# Patient Record
Sex: Male | Born: 1950 | Race: White | Hispanic: No | Marital: Married
Health system: Southern US, Community
[De-identification: ages and names within clinical notes are randomized; demographics above are authoritative.]

## PROBLEM LIST (undated history)

## (undated) DIAGNOSIS — E119 Type 2 diabetes mellitus without complications: Secondary | ICD-10-CM

## (undated) DIAGNOSIS — I509 Heart failure, unspecified: Secondary | ICD-10-CM

## (undated) DIAGNOSIS — I10 Essential (primary) hypertension: Secondary | ICD-10-CM

---

## 2020-06-29 ENCOUNTER — Ambulatory Visit: Payer: Self-pay | Admitting: Cardiology

## 2020-07-12 DIAGNOSIS — R1900 Intra-abdominal and pelvic swelling, mass and lump, unspecified site: Secondary | ICD-10-CM | POA: Diagnosis not present

## 2020-07-12 DIAGNOSIS — R590 Localized enlarged lymph nodes: Secondary | ICD-10-CM | POA: Diagnosis not present

## 2020-07-12 DIAGNOSIS — Z6820 Body mass index (BMI) 20.0-20.9, adult: Secondary | ICD-10-CM | POA: Diagnosis not present

## 2020-07-13 ENCOUNTER — Other Ambulatory Visit: Payer: Self-pay | Admitting: Nurse Practitioner

## 2020-07-13 DIAGNOSIS — R1900 Intra-abdominal and pelvic swelling, mass and lump, unspecified site: Secondary | ICD-10-CM

## 2020-07-14 ENCOUNTER — Other Ambulatory Visit: Payer: Self-pay

## 2020-07-14 ENCOUNTER — Other Ambulatory Visit: Payer: Self-pay | Admitting: Internal Medicine

## 2020-07-14 ENCOUNTER — Ambulatory Visit
Admission: RE | Admit: 2020-07-14 | Discharge: 2020-07-14 | Disposition: A | Discharge status: Home / Self Care | Payer: Medicare HMO | Source: Ambulatory Visit | Attending: Nurse Practitioner | Admitting: Nurse Practitioner

## 2020-07-14 ENCOUNTER — Other Ambulatory Visit: Payer: Self-pay | Admitting: Nurse Practitioner

## 2020-07-14 DIAGNOSIS — R590 Localized enlarged lymph nodes: Secondary | ICD-10-CM | POA: Diagnosis not present

## 2020-07-14 DIAGNOSIS — I700 Atherosclerosis of aorta: Secondary | ICD-10-CM | POA: Diagnosis not present

## 2020-07-14 DIAGNOSIS — R1900 Intra-abdominal and pelvic swelling, mass and lump, unspecified site: Secondary | ICD-10-CM | POA: Diagnosis not present

## 2020-07-14 DIAGNOSIS — Z8739 Personal history of other diseases of the musculoskeletal system and connective tissue: Secondary | ICD-10-CM | POA: Diagnosis not present

## 2020-07-14 HISTORY — DX: Essential (primary) hypertension: I10

## 2020-07-14 HISTORY — DX: Heart failure, unspecified: I50.9

## 2020-07-14 HISTORY — DX: Type 2 diabetes mellitus without complications: E11.9

## 2020-07-14 LAB — POCT I-STAT CREATININE: Creatinine, Ser: 0.9 mg/dL (ref 0.61–1.24)

## 2020-07-14 MED ORDER — IOHEXOL 300 MG/ML  SOLN
100.0000 mL | Freq: Once | INTRAMUSCULAR | Status: AC | PRN
  Administered 2020-07-14: 100 mL via INTRAVENOUS

## 2020-07-15 ENCOUNTER — Other Ambulatory Visit: Payer: Self-pay | Admitting: Nurse Practitioner

## 2020-07-15 DIAGNOSIS — R590 Localized enlarged lymph nodes: Secondary | ICD-10-CM | POA: Diagnosis not present

## 2020-07-15 DIAGNOSIS — R1909 Other intra-abdominal and pelvic swelling, mass and lump: Secondary | ICD-10-CM

## 2020-07-19 ENCOUNTER — Other Ambulatory Visit: Payer: Self-pay | Admitting: Nurse Practitioner

## 2020-07-21 ENCOUNTER — Ambulatory Visit: Payer: Medicare HMO

## 2020-07-26 NOTE — Progress Notes (Signed)
Patient on schedule for Inguinal Biospy, called and spoke with wife/patient on phone with pre procedure instructions given. Made aware to be here @ 0900, NPO after MN, and driver for discharge/recovery after procedure. Stated understanding.

## 2020-07-27 ENCOUNTER — Encounter: Payer: Self-pay | Admitting: Cardiology

## 2020-07-28 ENCOUNTER — Other Ambulatory Visit: Payer: Self-pay | Admitting: Radiology

## 2020-07-29 ENCOUNTER — Ambulatory Visit
Admission: RE | Admit: 2020-07-29 | Discharge: 2020-07-29 | Disposition: A | Discharge status: Home / Self Care | Payer: Medicare HMO | Source: Ambulatory Visit | Attending: Nurse Practitioner | Admitting: Nurse Practitioner

## 2020-07-29 ENCOUNTER — Other Ambulatory Visit: Payer: Self-pay

## 2020-07-29 ENCOUNTER — Ambulatory Visit: Payer: Medicare HMO

## 2020-07-29 DIAGNOSIS — R1909 Other intra-abdominal and pelvic swelling, mass and lump: Secondary | ICD-10-CM

## 2020-07-29 DIAGNOSIS — R590 Localized enlarged lymph nodes: Secondary | ICD-10-CM | POA: Insufficient documentation

## 2020-07-29 DIAGNOSIS — C8205 Follicular lymphoma grade I, lymph nodes of inguinal region and lower limb: Secondary | ICD-10-CM | POA: Diagnosis not present

## 2020-07-29 MED ORDER — FENTANYL CITRATE (PF) 100 MCG/2ML IJ SOLN
INTRAMUSCULAR | Status: AC
  Filled 2020-07-29: qty 2

## 2020-07-29 MED ORDER — SODIUM CHLORIDE 0.9 % IV SOLN: INTRAVENOUS | Status: DC

## 2020-07-29 MED ORDER — FENTANYL CITRATE (PF) 100 MCG/2ML IJ SOLN
INTRAMUSCULAR | Status: AC | PRN
  Administered 2020-07-29: 50 ug via INTRAVENOUS

## 2020-07-29 MED ORDER — MIDAZOLAM HCL 5 MG/5ML IJ SOLN
INTRAMUSCULAR | Status: AC | PRN
  Administered 2020-07-29: 1 mg via INTRAVENOUS

## 2020-07-29 MED ORDER — MIDAZOLAM HCL 5 MG/5ML IJ SOLN
INTRAMUSCULAR | Status: AC
  Filled 2020-07-29: qty 5

## 2020-07-29 NOTE — H&P (Signed)
Chief Complaint: Left inguinal adenopathy. Request is for left inguinal lymph node biopsy  Referring Physician(s): Lam,Lynn E  Supervising Physician: Sandi Mariscal  Patient Status: [facility] - Out-pt  History of Present Illness: Barry Fisher is a 70 y.o. male  History of HTN,  OA, COPD, CAD GERD. Presented to primary care with abdominal pain that radiates to the groin and multiple small masses in his lower pelvic region.  CT Abd Pelvis from 1.27.22. Abdominopelvic and left inguinal adenopathy is identified. Etiology is indeterminate. Differential considerations include reactive adenopathy, metastatic adenopathy, or lymphoproliferative disorder. Recommend further evaluation with tissue sampling. The left inguinal lymph nodes should be easily amenable to percutaneous biopsy under ultrasound guidance. team is requesting a left inguinal lymph node biopsy for further evaluation.    Currently reporting pain to his left inguinal area with radiation down his left thigh. Barry Fisher also reports a new swelling to his right groin that is tender with palpation.  Patient alert and laying in bed, calm and comfortable. Denies any fevers, headache, chest pain, SOB, cough, abdominal pain, nausea, vomiting or bleeding. Return precautions and treatment recommendations and follow-up discussed with the patient who is agreeable with the plan.   Past Medical History:  Diagnosis Date  . CHF (congestive heart failure) ([place])   . Diabetes mellitus without complication ([place])   . Hypertension     Allergies: Patient has no allergy information on record.  Medications: Prior to Admission medications   Not on File     No family history on file.  Social History   Socioeconomic History  . Marital status: Married    Spouse name: Not on file  . Number of children: Not on file  . Years of education: Not on file  . Highest education level: Not on file  Occupational History  . Not on file  Tobacco Use  .  Smoking status: Not on file  . Smokeless tobacco: Not on file  Substance and Sexual Activity  . Alcohol use: Not on file  . Drug use: Not on file  . Sexual activity: Not on file  Other Topics Concern  . Not on file  Social History Narrative  . Not on file   Social Determinants of Health   Financial Resource Strain: Not on file  Food Insecurity: Not on file  Transportation Needs: Not on file  Physical Activity: Not on file  Stress: Not on file  Social Connections: Not on file    Review of Systems: A 12 point ROS discussed and pertinent positives are indicated in the HPI above.  All other systems are negative.  Review of Systems  Constitutional: Negative for fever.  HENT: Negative for congestion.   Respiratory: Negative for cough and shortness of breath.   Cardiovascular: Negative for chest pain.  Gastrointestinal: Negative for abdominal pain.  Musculoskeletal: Positive for myalgias (left groin pain with radiation down left thigh. tenderness to right groin.).  Neurological: Negative for headaches.  Psychiatric/Behavioral: Negative for behavioral problems and confusion.    Vital Signs: There were no vitals taken for this visit.  Physical Exam Vitals and nursing note reviewed.  Constitutional:      Appearance: He is well-developed and well-nourished.  HENT:     Head: Normocephalic.  Cardiovascular:     Rate and Rhythm: Normal rate and regular rhythm.     Comments: Sternal surgical scar noted with left sided cardiac device.  Pulmonary:     Effort: Pulmonary effort is normal.     Breath  sounds: Normal breath sounds.  Musculoskeletal:        General: Normal range of motion.     Cervical back: Normal range of motion.  Skin:    General: Skin is dry.  Neurological:     Mental Status: He is alert and oriented to person, place, and time.  Psychiatric:        Mood and Affect: Mood and affect normal.     Imaging: CT ABDOMEN PELVIS W CONTRAST  Result Date:  07/14/2020 CLINICAL DATA:  Swelling and knots within groin area.  Low foot EXAM: CT ABDOMEN AND PELVIS WITH CONTRAST TECHNIQUE: Multidetector CT imaging of the abdomen and pelvis was performed using the standard protocol following bolus administration of intravenous contrast. CONTRAST:  158mL OMNIPAQUE IOHEXOL 300 MG/ML  SOLN COMPARISON:  None. FINDINGS: Lower chest: No acute abnormality. Hepatobiliary: No focal liver abnormality is seen. Status post cholecystectomy. No biliary dilatation. Pancreas: Unremarkable. No pancreatic ductal dilatation or surrounding inflammatory changes. Spleen: Normal in size without focal abnormality. Adrenals/Urinary Tract: Normal adrenal glands. No kidney mass or hydronephrosis. The urinary bladder is unremarkable. Stomach/Bowel: The stomach is nondistended. No bowel wall thickening, inflammation, or distension. Distal colonic diverticula noted. No signs of acute diverticulitis. Vascular/Lymphatic: Aortic atherosclerosis.  No aneurysm. Abdominopelvic adenopathy is identified. Left periaortic lymph node measures 1.2 cm, image 43/2. Left external iliac lymph node measures 1.9 cm, image 63/2. Distal left external iliac lymph node measures 1.7 cm, image 71/2. Within the left inguinal region there are multiple enlarged nodes. The largest has a short axis of 1.9 cm, image 80/2. Reproductive: Prostate is unremarkable. Other: No free fluid or fluid collections identified within the abdomen or pelvis. Musculoskeletal: No acute or significant osseous findings. Mild multilevel degenerative disc disease noted. Most advanced at L5-S1. No acute or suspicious osseous findings. IMPRESSION: 1. No acute findings identified within the abdomen or pelvis. 2. Abdominopelvic and left inguinal adenopathy is identified. Etiology is indeterminate. Differential considerations include reactive adenopathy, metastatic adenopathy, or lymphoproliferative disorder. Recommend further evaluation with tissue sampling.  The left inguinal lymph nodes should be easily amenable to percutaneous biopsy under ultrasound guidance. 3. Aortic atherosclerosis. Aortic Atherosclerosis (ICD10-I70.0). Electronically Signed   By: Kerby Moors M.D.   On: 07/14/2020 11:37    Labs:  CBC: No results for input(s): WBC, HGB, HCT, PLT in the last 8760 hours.  COAGS: No results for input(s): INR, APTT in the last 8760 hours.  BMP: Recent Labs    07/14/20 1058  CREATININE 0.90    LIVER FUNCTION TESTS: No results for input(s): BILITOT, AST, ALT, ALKPHOS, PROT, ALBUMIN in the last 8760 hours.   Assessment and Plan:  70 y.o. male outpatient. History of HTN,  OA, COPD, CAD GERD. Presented to primary care with abdominal pain that radiates to the groin and multiple small masses in his lower pelvic region.  CT Abd Pelvis from 1.27.22 Abdominopelvic and left inguinal adenopathy is identified. Etiology is indeterminate. Differential considerations include reactive adenopathy, metastatic adenopathy, or lymphoproliferative disorder. Recommend further evaluation with tissue sampling. The left inguinal lymph nodes should be easily amenable to percutaneous biopsy under ultrasound guidance. team is requesting a left inguinal lymph node biopsy for further evaluation.   No recent labs. medications are within acceptable parameters. Allergies not on file. Patient has been NPO since midnight,   Risks and benefits of  Left inguinal lymph node biopsy was discussed with the patient and/or patient's family including, but not limited to bleeding, infection, damage to adjacent structures or  low yield requiring additional tests.  All of the questions were answered and there is agreement to proceed.  Consent signed and in chart.   Thank you for this interesting consult.  I greatly enjoyed meeting Barry Fisher and look forward to participating in their care.  A copy of this report was sent to the requesting provider on this  date.  Electronically Signed: Jacqualine Mau, NP 07/29/2020, 9:10 AM   I spent a total of  30 Minutes   in face to face in clinical consultation, greater than 50% of which was counseling/coordinating care for left inguinal lymph node biopsy

## 2020-07-29 NOTE — Procedures (Signed)
Pre Procedure Dx: Inguinal lymphadenopathy Post Procedural Dx: Same  Technically successful US guided biopsy of indeterminate left inguinal lymph node.  EBL: None No immediate complications.   Ronny Bacon, MD Pager #: [phone]

## 2020-08-02 ENCOUNTER — Encounter: Payer: Self-pay | Admitting: Interventional Radiology

## 2020-08-02 LAB — SURGICAL PATHOLOGY

## 2020-08-05 DIAGNOSIS — J449 Chronic obstructive pulmonary disease, unspecified: Secondary | ICD-10-CM | POA: Diagnosis not present

## 2020-08-05 DIAGNOSIS — Z6820 Body mass index (BMI) 20.0-20.9, adult: Secondary | ICD-10-CM | POA: Diagnosis not present

## 2020-08-05 DIAGNOSIS — Z1331 Encounter for screening for depression: Secondary | ICD-10-CM | POA: Diagnosis not present

## 2020-08-05 DIAGNOSIS — C8296 Follicular lymphoma, unspecified, intrapelvic lymph nodes: Secondary | ICD-10-CM | POA: Diagnosis not present

## 2020-08-05 DIAGNOSIS — R1900 Intra-abdominal and pelvic swelling, mass and lump, unspecified site: Secondary | ICD-10-CM | POA: Diagnosis not present

## 2020-08-05 DIAGNOSIS — I10 Essential (primary) hypertension: Secondary | ICD-10-CM | POA: Diagnosis not present

## 2020-08-05 DIAGNOSIS — F332 Major depressive disorder, recurrent severe without psychotic features: Secondary | ICD-10-CM | POA: Diagnosis not present

## 2020-08-16 DEATH — deceased

## 2021-08-20 IMAGING — US US BIOPSY LYMPH NODE
1 series · 14 of 21 positions shown · non-contrast
Comparison: CT abdomen and pelvis-07/14/2020

INDICATION: No known primary now with indeterminate retroperitoneal and left
inguinal lymphadenopathy. Please perform ultrasound-guided biopsy
for tissue diagnostic purposes.

EXAM:
ULTRASOUND-GUIDED BIOPSY OF INDETERMINATE LEFT INGUINAL LYMPH NODE
TECHNIQUE: Informed written consent was obtained from the patient after a
discussion of the risks, benefits and alternatives to treatment.
Questions regarding the procedure were encouraged and answered.
Initial ultrasound scanning demonstrated multiple pathologically
enlarged left inguinal lymph nodes with dominant left inguinal lymph
node measuring 3.3 x 1.7 cm (image 5), correlating with the dominant
lymph node seen preceding abdominal CT image 81, series 2. An
ultrasound image was saved for documentation purposes. The procedure
was planned. A timeout was performed prior to the initiation of the
procedure.

[Series 1: us biopsy · 14 of 21 slices shown]
[im 1/21]
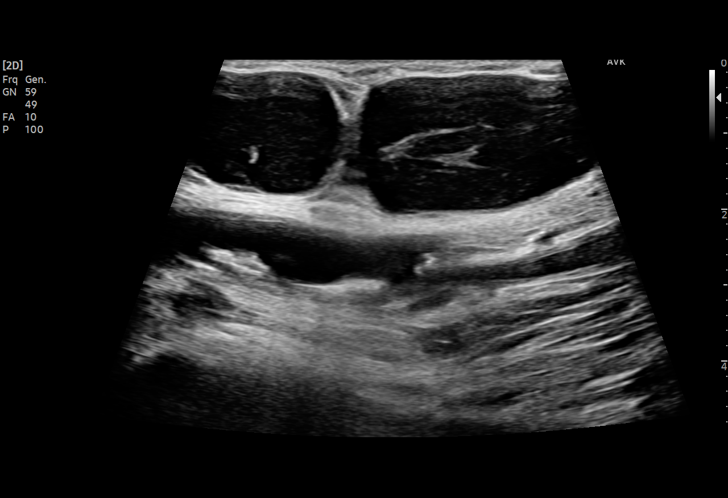
[im 3/21]
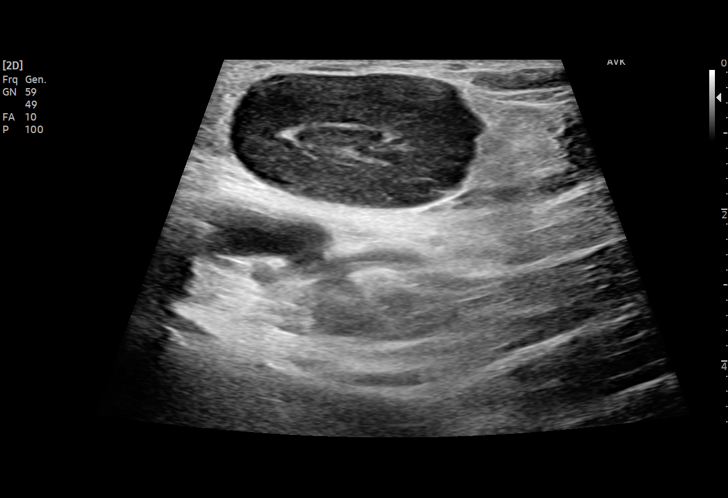
[im 4/21]
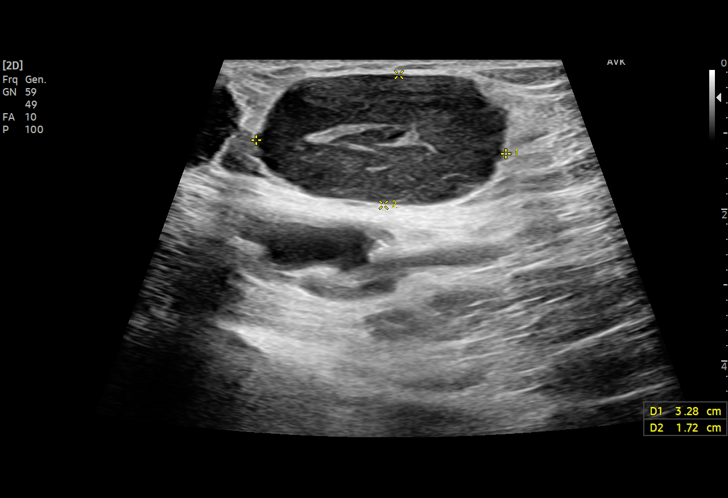
[im 6/21]
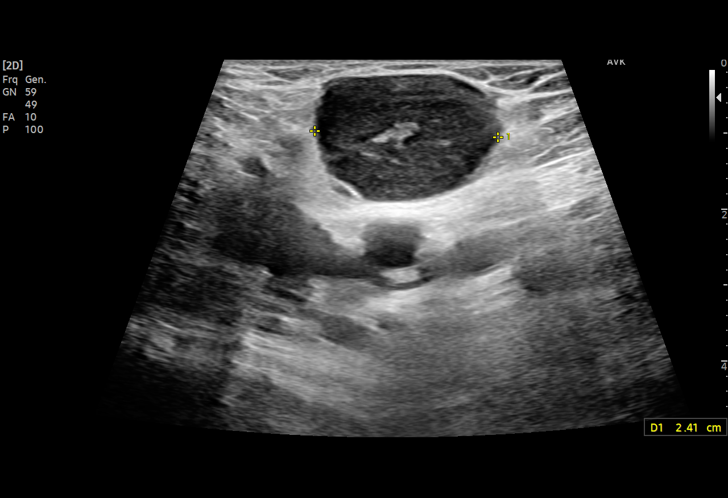
[im 7/21]
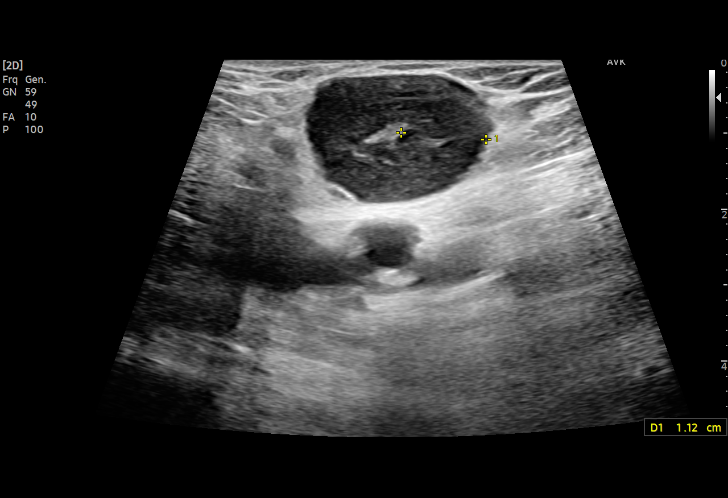
[im 9/21]
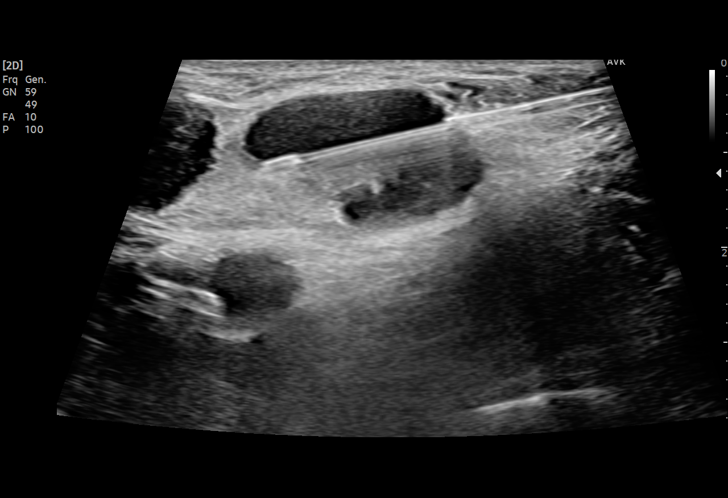
[im 10/21]
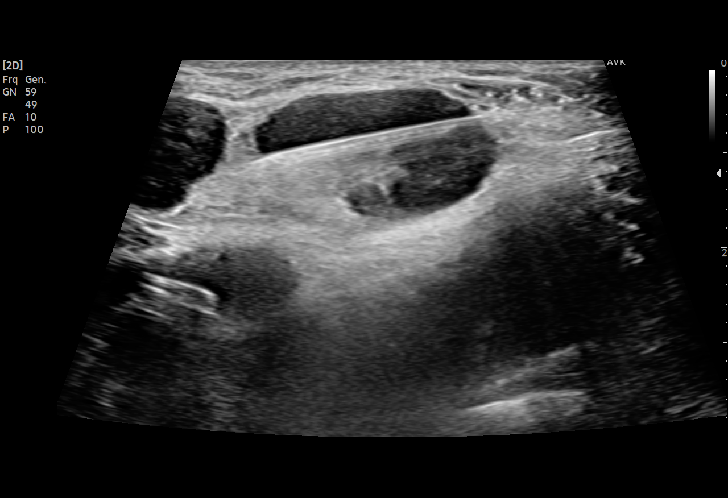
[im 12/21]
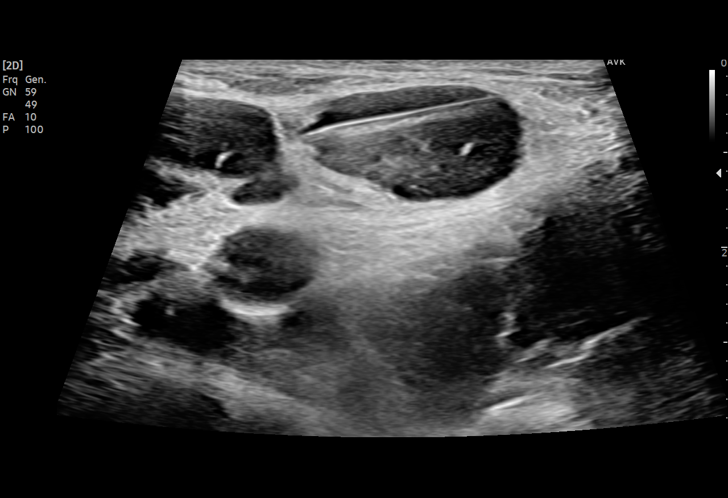
[im 13/21]
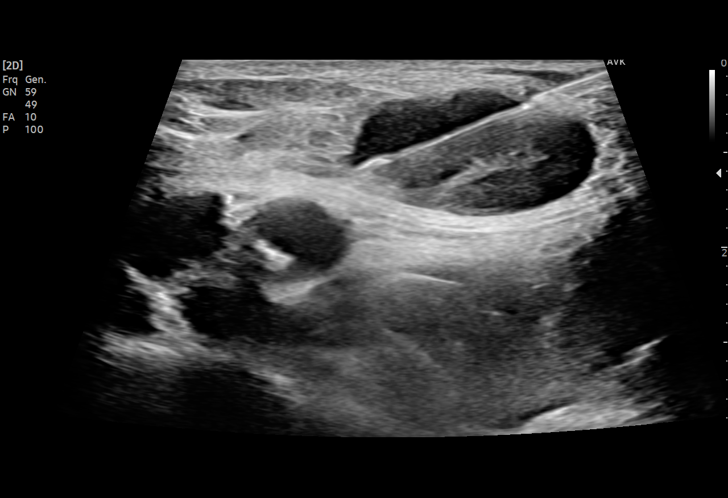
[im 15/21]
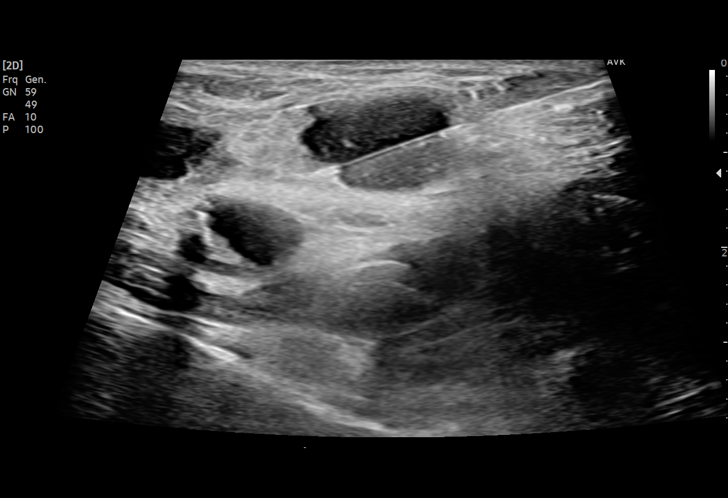
[im 16/21]
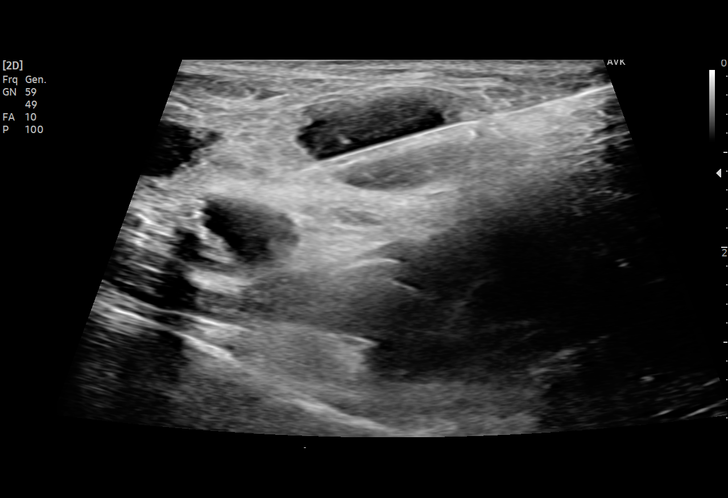
[im 18/21]
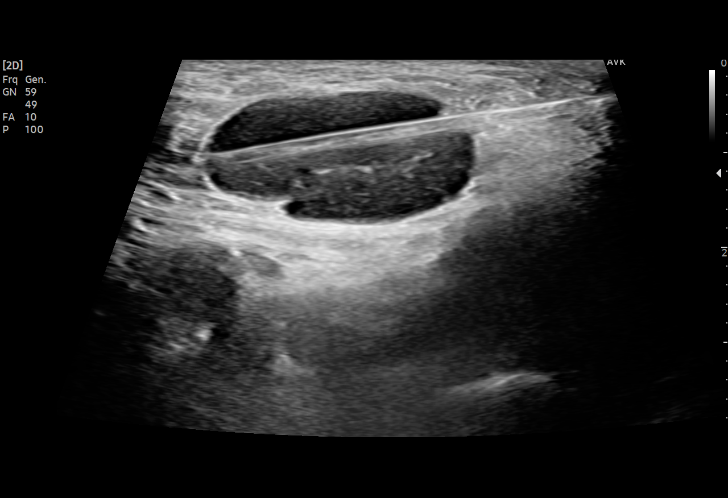
[im 19/21]
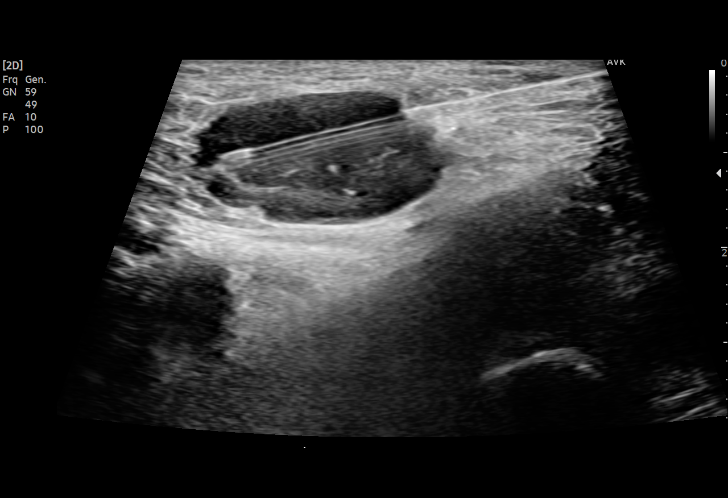
[im 21/21]
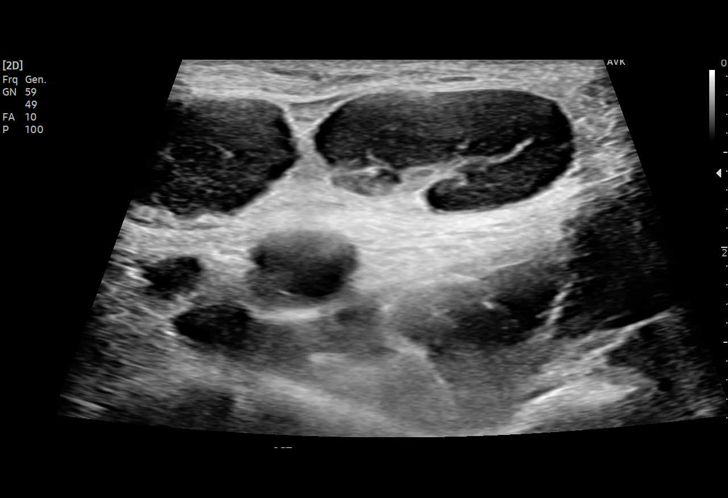

[14 of 21 positions shown; findings below may reference images not displayed]

MEDICATIONS:
None

ANESTHESIA/SEDATION:
Moderate (conscious) sedation was employed during this procedure. A
total of Versed 1 mg and Fentanyl 50 mcg was administered
intravenously.

Moderate Sedation Time: 10 minutes. The patient's level of
consciousness and vital signs were monitored continuously by
radiology nursing throughout the procedure under my direct
supervision.

COMPLICATIONS:
None immediate.
The operative was prepped and draped in the usual sterile fashion,
and a sterile drape was applied covering the operative field. A
timeout was performed prior to the initiation of the procedure.
Local anesthesia was provided with 1% lidocaine with epinephrine.

Under direct ultrasound guidance, an 18 gauge core needle device was
utilized to obtain to obtain 6 core needle biopsies of the
indeterminate left inguinal lymph node.

The samples were placed in saline and submitted to pathology. The
needle was removed and hemostasis was achieved with manual
compression. Post procedure scan was negative for significant
hematoma. A dressing was placed. The patient tolerated the procedure
well without immediate postprocedural complication.
IMPRESSION: Technically successful ultrasound guided biopsy of dominant
indeterminate left inguinal lymph node.
